# Patient Record
Sex: Female | Born: 2000 | Race: Black or African American | Hispanic: No | Marital: Single | Smoking: Current every day smoker
Health system: Southern US, Community
[De-identification: ages and names within clinical notes are randomized; demographics above are authoritative.]

---

## 2020-02-04 ENCOUNTER — Emergency Department (HOSPITAL_COMMUNITY)
Admission: EM | Admit: 2020-02-04 | Discharge: 2020-02-04 | Disposition: A | Discharge status: Home / Self Care | Payer: PRIVATE HEALTH INSURANCE | Attending: Emergency Medicine | Admitting: Emergency Medicine

## 2020-02-04 ENCOUNTER — Emergency Department (HOSPITAL_COMMUNITY): Payer: PRIVATE HEALTH INSURANCE

## 2020-02-04 ENCOUNTER — Encounter (HOSPITAL_COMMUNITY): Payer: Self-pay | Admitting: Emergency Medicine

## 2020-02-04 DIAGNOSIS — R519 Headache, unspecified: Secondary | ICD-10-CM | POA: Diagnosis not present

## 2020-02-04 DIAGNOSIS — Y92410 Unspecified street and highway as the place of occurrence of the external cause: Secondary | ICD-10-CM | POA: Insufficient documentation

## 2020-02-04 DIAGNOSIS — M79605 Pain in left leg: Secondary | ICD-10-CM | POA: Insufficient documentation

## 2020-02-04 DIAGNOSIS — M542 Cervicalgia: Secondary | ICD-10-CM | POA: Diagnosis not present

## 2020-02-04 DIAGNOSIS — M25522 Pain in left elbow: Secondary | ICD-10-CM | POA: Insufficient documentation

## 2020-02-04 MED ORDER — METHOCARBAMOL 500 MG PO TABS: 500.0000 mg | ORAL_TABLET | Freq: Two times a day (BID) | ORAL | 0 refills | Status: DC

## 2020-02-04 MED ORDER — ACETAMINOPHEN 325 MG PO TABS
650.0000 mg | ORAL_TABLET | Freq: Once | ORAL | Status: AC
  Administered 2020-02-04: 650 mg via ORAL
  Filled 2020-02-04: qty 2

## 2020-02-04 MED ORDER — NAPROXEN 375 MG PO TABS: 375.0000 mg | ORAL_TABLET | Freq: Two times a day (BID) | ORAL | 0 refills | Status: AC
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
## 2020-02-04 NOTE — ED Notes (Signed)
ED Provider at bedside. 
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
## 2020-02-04 NOTE — ED Notes (Signed)
Patient transported to CT 

## 2020-02-04 NOTE — ED Notes (Signed)
Pt refused d/c vital signs 

## 2020-09-18 ENCOUNTER — Other Ambulatory Visit: Payer: Self-pay

## 2020-09-18 ENCOUNTER — Encounter (HOSPITAL_COMMUNITY): Payer: Self-pay | Admitting: Emergency Medicine

## 2020-09-18 ENCOUNTER — Emergency Department (HOSPITAL_COMMUNITY)
Admission: EM | Admit: 2020-09-18 | Discharge: 2020-09-19 | Disposition: A | Discharge status: Home / Self Care | Payer: 59 | Attending: Emergency Medicine | Admitting: Emergency Medicine

## 2020-09-18 DIAGNOSIS — S79921A Unspecified injury of right thigh, initial encounter: Secondary | ICD-10-CM | POA: Diagnosis present

## 2020-09-18 DIAGNOSIS — Z203 Contact with and (suspected) exposure to rabies: Secondary | ICD-10-CM | POA: Diagnosis not present

## 2020-09-18 DIAGNOSIS — S40811A Abrasion of right upper arm, initial encounter: Secondary | ICD-10-CM | POA: Insufficient documentation

## 2020-09-18 DIAGNOSIS — Z23 Encounter for immunization: Secondary | ICD-10-CM | POA: Insufficient documentation

## 2020-09-18 DIAGNOSIS — W540XXA Bitten by dog, initial encounter: Secondary | ICD-10-CM | POA: Diagnosis not present

## 2020-09-18 DIAGNOSIS — S40812A Abrasion of left upper arm, initial encounter: Secondary | ICD-10-CM | POA: Diagnosis not present

## 2020-09-18 DIAGNOSIS — F17210 Nicotine dependence, cigarettes, uncomplicated: Secondary | ICD-10-CM | POA: Insufficient documentation

## 2020-09-18 DIAGNOSIS — S70311A Abrasion, right thigh, initial encounter: Secondary | ICD-10-CM | POA: Diagnosis not present

## 2020-09-18 DIAGNOSIS — Z2914 Encounter for prophylactic rabies immune globin: Secondary | ICD-10-CM | POA: Diagnosis not present

## 2020-09-18 LAB — POC URINE PREG, ED: Preg Test, Ur: NEGATIVE

## 2020-09-18 MED ORDER — OXYCODONE-ACETAMINOPHEN 5-325 MG PO TABS
2.0000 | ORAL_TABLET | Freq: Once | ORAL | Status: AC
  Administered 2020-09-18: 2 via ORAL
  Filled 2020-09-18: qty 2

## 2020-09-18 MED ORDER — AMOXICILLIN-POT CLAVULANATE 875-125 MG PO TABS
1.0000 | ORAL_TABLET | Freq: Once | ORAL | Status: AC
  Administered 2020-09-18: 1 via ORAL
  Filled 2020-09-18: qty 1

## 2020-09-18 MED ORDER — RABIES IMMUNE GLOBULIN 150 UNIT/ML IM INJ
20.0000 [IU]/kg | INJECTION | Freq: Once | INTRAMUSCULAR | Status: AC
  Administered 2020-09-18: 1200 [IU] via INTRAMUSCULAR
  Filled 2020-09-18: qty 8

## 2020-09-18 MED ORDER — TETANUS-DIPHTH-ACELL PERTUSSIS 5-2.5-18.5 LF-MCG/0.5 IM SUSY
0.5000 mL | PREFILLED_SYRINGE | Freq: Once | INTRAMUSCULAR | Status: AC
  Administered 2020-09-18: 0.5 mL via INTRAMUSCULAR
  Filled 2020-09-18: qty 0.5

## 2020-09-18 MED ORDER — RABIES VACCINE, PCEC IM SUSR
1.0000 mL | Freq: Once | INTRAMUSCULAR | Status: AC
  Administered 2020-09-18: 1 mL via INTRAMUSCULAR
  Filled 2020-09-18: qty 1

## 2020-09-18 NOTE — Discharge Instructions (Addendum)
                                  RABIES VACCINE FOLLOW UP  Patient's Name: Diana Summers                     Original Order Date:09/18/2020  Medical Record Number: [id number]  ED Physician: No att. 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 Species of Animal:     You have been seen in the Emergency Department for a possible rabies exposure. It's very important you return for the additional vaccine doses.  Please call the clinic listed below for hours of operation.   Clinic that will administer your rabies vaccines:  [facility] Address: [address] [place], [place], [place] [address] Phone: [phone]    DAY 0:  09/18/2020      DAY 3:  09/21/2020       DAY 7:  09/25/2020     DAY 14:  10/02/2020         The 5th vaccine injection is considered for immune compromised patients only.  DAY 28:  10/16/2020

## 2020-09-18 NOTE — ED Triage Notes (Signed)
Patient states she was attacked by multiple pit bulls with unknown vaccine status. Superficial bites to legs
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
## 2020-09-19 MED ORDER — AMOXICILLIN-POT CLAVULANATE 875-125 MG PO TABS: 1.0000 | ORAL_TABLET | Freq: Two times a day (BID) | ORAL | 0 refills | Status: AC

## 2020-09-19 NOTE — ED Notes (Signed)
Pt refused to sign for d/c, stating "my ride is here." Expressed understanding of discharge instructions and received d/c papers.
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
## 2021-10-19 ENCOUNTER — Encounter (HOSPITAL_COMMUNITY): Payer: Self-pay | Admitting: Emergency Medicine

## 2021-10-19 ENCOUNTER — Other Ambulatory Visit: Payer: Self-pay

## 2021-10-19 ENCOUNTER — Emergency Department (HOSPITAL_COMMUNITY)
Admission: EM | Admit: 2021-10-19 | Discharge: 2021-10-19 | Disposition: A | Discharge status: Home / Self Care | Payer: 59 | Attending: Emergency Medicine | Admitting: Emergency Medicine

## 2021-10-19 DIAGNOSIS — Y92410 Unspecified street and highway as the place of occurrence of the external cause: Secondary | ICD-10-CM | POA: Diagnosis not present

## 2021-10-19 DIAGNOSIS — R519 Headache, unspecified: Secondary | ICD-10-CM | POA: Insufficient documentation

## 2021-10-19 DIAGNOSIS — S161XXA Strain of muscle, fascia and tendon at neck level, initial encounter: Secondary | ICD-10-CM | POA: Insufficient documentation

## 2021-10-19 DIAGNOSIS — S199XXA Unspecified injury of neck, initial encounter: Secondary | ICD-10-CM | POA: Diagnosis present

## 2021-10-19 MED ORDER — METHOCARBAMOL 500 MG PO TABS: 500.0000 mg | ORAL_TABLET | Freq: Two times a day (BID) | ORAL | 0 refills | Status: AC

## 2021-10-19 MED ORDER — METHOCARBAMOL 500 MG PO TABS
500.0000 mg | ORAL_TABLET | Freq: Once | ORAL | Status: AC
  Administered 2021-10-19: 500 mg via ORAL
  Filled 2021-10-19: qty 1
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
## 2021-11-28 ENCOUNTER — Emergency Department (HOSPITAL_COMMUNITY)
Admission: EM | Admit: 2021-11-28 | Discharge: 2021-11-28 | Disposition: A | Discharge status: Home / Self Care | Payer: Managed Care, Other (non HMO) | Attending: Emergency Medicine | Admitting: Emergency Medicine

## 2021-11-28 ENCOUNTER — Encounter (HOSPITAL_COMMUNITY): Payer: Self-pay | Admitting: Emergency Medicine

## 2021-11-28 ENCOUNTER — Ambulatory Visit: Payer: Self-pay | Admitting: *Deleted

## 2021-11-28 ENCOUNTER — Other Ambulatory Visit: Payer: Self-pay

## 2021-11-28 DIAGNOSIS — H5789 Other specified disorders of eye and adnexa: Secondary | ICD-10-CM | POA: Diagnosis present

## 2021-11-28 DIAGNOSIS — H1031 Unspecified acute conjunctivitis, right eye: Secondary | ICD-10-CM | POA: Diagnosis not present

## 2021-11-28 MED ORDER — BACITRACIN-POLYMYXIN B 500-10000 UNIT/GM OP OINT: TOPICAL_OINTMENT | OPHTHALMIC | 0 refills | Status: AC
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
## 2021-11-28 NOTE — ED Provider Notes (Signed)
[facility]-EMERGENCY DEPT Provider Note   CSN: [id number] Arrival date & time: 11/28/21  1251     History  Chief Complaint  Patient presents with   Conjunctivitis    Diana Summers is a 21 y.o. female.  The history is provided by the patient. No language interpreter was used.  Eye Problem Location:  Left eye Quality:  Burning and tearing Severity:  Moderate Onset quality:  Gradual Duration:  4 days Timing:  Constant Progression:  Unchanged Chronicity:  New Relieved by:  Nothing Worsened by:  Nothing      Home Medications Prior to Admission medications   Medication Sig Start Date End Date Taking? Authorizing Provider  amoxicillin-clavulanate (AUGMENTIN) 875-125 MG tablet Take 1 tablet by mouth 2 (two) times daily. One po bid x 7 days 09/19/20   Mesner, Barbara Cower, MD  methocarbamol (ROBAXIN) 500 MG tablet Take 1 tablet (500 mg total) by mouth 2 (two) times daily. 10/19/21   Prosperi, Christian H, PA-C  naproxen (NAPROSYN) 375 MG tablet Take 1 tablet (375 mg total) by mouth 2 (two) times daily. 02/04/20   Farrel Gordon, PA-C      Allergies    Strawberry (diagnostic)    Review of Systems   Review of Systems  Physical Exam Updated Vital Signs BP 110/75 (BP Location: Left Arm)   Pulse 64   Temp 99.1 F (37.3 C) (Oral)   Resp 16   SpO2 97%  Physical Exam Vitals and nursing note reviewed.  Constitutional:      General: She is not in acute distress.    Appearance: She is well-developed. She is not diaphoretic.  HENT:     Head: Normocephalic and atraumatic.     Right Ear: External ear normal.     Left Ear: External ear normal.     Nose: Nose normal.     Mouth/Throat:     Mouth: Mucous membranes are moist.  Eyes:     General: No scleral icterus.    Comments: Right eye with findings of typical conjunctivitis noted; erythema and discharge. PERRLA, no foreign body noted. No periorbital cellulitis. The corneas are clear and fundi normal. Visual  acuity normal.     Cardiovascular:     Rate and Rhythm: Normal rate and regular rhythm.     Heart sounds: Normal heart sounds. No murmur heard.    No friction rub. No gallop.  Pulmonary:     Effort: Pulmonary effort is normal. No respiratory distress.     Breath sounds: Normal breath sounds.  Abdominal:     General: Bowel sounds are normal. There is no distension.     Palpations: Abdomen is soft. There is no mass.     Tenderness: There is no abdominal tenderness. There is no guarding.  Musculoskeletal:     Cervical back: Normal range of motion.  Skin:    General: Skin is warm and dry.  Neurological:     Mental Status: She is alert and oriented to person, place, and time.  Psychiatric:        Behavior: Behavior normal.     ED Results / Procedures / Treatments   Labs (all labs ordered are listed, but only abnormal results are displayed) Labs Reviewed - No data to display  EKG None  Radiology No results found.  Procedures Procedures    Medications Ordered in ED Medications - No data to display  ED Course/ Medical Decision Making/ A&P  Medical Decision Making  Conjunctivitis - probably bacterial   Antibiotic ointment. Hygiene discussed. If other family members develop same condition, may use same medication for them if they are not known to be allergic to it. Call prn. No evidence of iritis or uveitis  Risk Prescription drug management.           Final Clinical Impression(s) / ED Diagnoses Final diagnoses:  Acute bacterial conjunctivitis of right eye    Rx / DC Orders ED Discharge Orders     None         Arthor Captain, PA-C 11/28/21 1511    Linwood Dibbles, MD 11/29/21 1623

## 2021-11-28 NOTE — Discharge Instructions (Signed)
Contact a health care provider if: You have a fever. Your symptoms do not get better after 10 days. Get help right away if: You have a fever and your symptoms suddenly get worse. You have severe pain when you move your eye. You have facial pain, redness, or swelling. You have a sudden loss of vision. 

## 2021-11-28 NOTE — Telephone Encounter (Signed)
  Chief Complaint: eye pain, discharge Symptoms: eye swelling, pain, discharge Frequency: 4 days Pertinent Negatives: Patient denies fever, runny nose, cough Disposition: [] ED /[] Urgent Care (no appt availability in office) / [] Appointment(In office/virtual)/ []  [facility]/ [] Home Care/ [] Refused Recommended Disposition /[x] [facility]/ []  Follow-up with PCP Additional Notes: Patient does not have pCP- advised mobile unit, UC
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
